# Patient Record
Sex: Male | Born: 1985 | Race: Black or African American | Hispanic: No | Marital: Single | Smoking: Never smoker
Health system: Southern US, Community
[De-identification: ages and names within clinical notes are randomized; demographics above are authoritative.]

---

## 2018-10-02 ENCOUNTER — Other Ambulatory Visit: Payer: Self-pay

## 2018-10-02 ENCOUNTER — Encounter (HOSPITAL_COMMUNITY): Payer: Self-pay

## 2018-10-02 ENCOUNTER — Emergency Department (HOSPITAL_COMMUNITY): Payer: PRIVATE HEALTH INSURANCE

## 2018-10-02 ENCOUNTER — Emergency Department (HOSPITAL_COMMUNITY)
Admission: EM | Admit: 2018-10-02 | Discharge: 2018-10-02 | Disposition: A | Discharge status: Home / Self Care | Payer: PRIVATE HEALTH INSURANCE | Attending: Emergency Medicine | Admitting: Emergency Medicine

## 2018-10-02 DIAGNOSIS — W208XXA Other cause of strike by thrown, projected or falling object, initial encounter: Secondary | ICD-10-CM | POA: Diagnosis not present

## 2018-10-02 DIAGNOSIS — S6991XA Unspecified injury of right wrist, hand and finger(s), initial encounter: Secondary | ICD-10-CM | POA: Diagnosis present

## 2018-10-02 DIAGNOSIS — S60221A Contusion of right hand, initial encounter: Secondary | ICD-10-CM | POA: Diagnosis not present

## 2018-10-02 DIAGNOSIS — Y999 Unspecified external cause status: Secondary | ICD-10-CM | POA: Insufficient documentation

## 2018-10-02 DIAGNOSIS — Z23 Encounter for immunization: Secondary | ICD-10-CM | POA: Diagnosis not present

## 2018-10-02 DIAGNOSIS — Y9389 Activity, other specified: Secondary | ICD-10-CM | POA: Diagnosis not present

## 2018-10-02 DIAGNOSIS — L03113 Cellulitis of right upper limb: Secondary | ICD-10-CM | POA: Insufficient documentation

## 2018-10-02 DIAGNOSIS — Y929 Unspecified place or not applicable: Secondary | ICD-10-CM | POA: Diagnosis not present

## 2018-10-02 MED ORDER — CLINDAMYCIN HCL 300 MG PO CAPS: 300.0000 mg | ORAL_CAPSULE | Freq: Four times a day (QID) | ORAL | 0 refills | Status: AC

## 2018-10-02 MED ORDER — CLINDAMYCIN HCL 150 MG PO CAPS
300.0000 mg | ORAL_CAPSULE | Freq: Once | ORAL | Status: AC
  Administered 2018-10-02: 300 mg via ORAL
  Filled 2018-10-02: qty 2

## 2018-10-02 MED ORDER — TETANUS-DIPHTH-ACELL PERTUSSIS 5-2.5-18.5 LF-MCG/0.5 IM SUSP
0.5000 mL | Freq: Once | INTRAMUSCULAR | Status: AC
  Administered 2018-10-02: 0.5 mL via INTRAMUSCULAR
  Filled 2018-10-02: qty 0.5

## 2018-10-02 NOTE — ED Notes (Signed)
ED Provider at bedside. 

## 2018-10-02 NOTE — ED Notes (Signed)
Pt. returned from XR. 

## 2018-10-02 NOTE — ED Triage Notes (Signed)
Pt reports was working on car Tuesday night, pt hit palm with a screwdriver. Pt reports he was wearing a glove and didn't think it pierced through. Since, pt has had redness spread.

## 2018-10-02 NOTE — ED Provider Notes (Signed)
[facility] EMERGENCY DEPARTMENT Provider Note   CSN: [id number] Arrival date & time: 10/02/18  1224    History   Chief Complaint Chief Complaint  Patient presents with  . Hand Injury    HPI Kevin Steele is a 33 y.o. male.     32yo male presents with right hand pain and swelling. Patient is right hand dominant, was wearing gloves 2 days ago while working on an old car- holding a screwdriver to try and remove a rusty bolt. Patient states his hand slipped on the screwdriver and it hit him on the palm. States the screwdriver did not puncture the glove (no hole in the glove) but he did have a small wound/blood on his hand. Patient reports developing pain and swelling with redness in his palm. Pain is worse with movement of his fingers but is able to fully flex and extend the fingers. Last td is unknown. No other injuries or concerns.      History reviewed. No pertinent past medical history.  There are no active problems to display for this patient.   History reviewed. No pertinent surgical history.      Home Medications    Prior to Admission medications   Medication Sig Start Date End Date Taking? Authorizing Provider  clindamycin (CLEOCIN) 300 MG capsule Take 1 capsule (300 mg total) by mouth every 6 (six) hours for 10 days. 10/02/18 10/12/18  Jeannie Fend, PA-C    Family History History reviewed. No pertinent family history.  Social History Social History   Tobacco Use  . Smoking status: Never Smoker  . Smokeless tobacco: Never Used  Substance Use Topics  . Alcohol use: Not on file  . Drug use: Not on file     Allergies   Patient has no known allergies.   Review of Systems Review of Systems  Constitutional: Negative for fever.  Musculoskeletal: Positive for myalgias. Negative for joint swelling.  Skin: Positive for color change and wound.  Allergic/Immunologic: Negative for immunocompromised state.  Neurological: Negative for  weakness and numbness.  Hematological: Does not bruise/bleed easily.     Physical Exam Updated Vital Signs BP 125/86   Pulse 93   Temp 98.5 F (36.9 C) (Oral)   Resp 16   Ht 5\' 11"  (1.803 m)   Wt 77.1 kg   SpO2 98%   BMI 23.71 kg/m   Physical Exam Vitals signs and nursing note reviewed.  Constitutional:      General: He is not in acute distress.    Appearance: He is well-developed. He is not diaphoretic.  HENT:     Head: Normocephalic and atraumatic.  Cardiovascular:     Pulses: Normal pulses.  Pulmonary:     Effort: Pulmonary effort is normal.  Musculoskeletal: Normal range of motion.        General: Swelling and tenderness present.       Hands:  Skin:    General: Skin is warm and dry.     Findings: Erythema present.  Neurological:     Mental Status: He is alert and oriented to person, place, and time.  Psychiatric:        Behavior: Behavior normal.    Media Information   Document Information   Photos    10/02/2018 13:33  Attached To:  Hospital Encounter on 10/02/18  Source Information   Alden Hipp  [facility]      ED Treatments / Results  Labs (all labs ordered are listed,  but only abnormal results are displayed) Labs Reviewed - No data to display  EKG None  Radiology Dg Hand Complete Right  Result Date: 10/02/2018 CLINICAL DATA:  pain and swelling, wound to palm with a screw driver 2 days ago EXAM: RIGHT HAND - COMPLETE 3+ VIEW COMPARISON:  None. FINDINGS: No fracture or bone lesion. No bone resorption to suggest osteomyelitis. The joints are normally spaced and aligned. No arthropathic changes. Soft tissues are unremarkable. No radiopaque foreign body or soft tissue air. IMPRESSION: Negative. Electronically Signed   By: Amie Portland M.D.   On: 10/02/2018 13:21    Procedures Procedures (including critical care time)  Medications Ordered in ED Medications  Tdap (BOOSTRIX) injection 0.5 mL (0.5 mLs Intramuscular Given  10/02/18 1323)  clindamycin (CLEOCIN) capsule 300 mg (300 mg Oral Given 10/02/18 1323)     Initial Impression / Assessment and Plan / ED Course  I have reviewed the triage vital signs and the nursing notes.  Pertinent labs & imaging results that were available during my care of the patient were reviewed by me and considered in my medical decision making (see chart for details).  Clinical Course as of Oct 01 1405  Thu Oct 02, 2018  1402 32yo male with concern for infection right palm after injury 2 days ago. Patient states the glove he was wearing was intact, reports superficial wound to the palm of his hand at the time of the injury, now with pain and swelling to his hand. On exam, no changes to dorsum of the hand. Does not appear to have tenosynovitis at this time but discussed importance of monitoring and return to ER if not improving, otherwise, follow up with ortho. Given rx for clindamycin. Xr negative for bony injury or FB. Case discussed with Dr. Madilyn Hook, ER attending, agrees with plan of care.    [LM]    Clinical Course User Index [LM] Jeannie Fend, PA-C   Final Clinical Impressions(s) / ED Diagnoses   Final diagnoses:  Cellulitis of right hand  Contusion of right hand, initial encounter    ED Discharge Orders         Ordered    clindamycin (CLEOCIN) 300 MG capsule  Every 6 hours     10/02/18 1351           Alden Hipp 10/02/18 1407    Tilden Fossa, MD 10/03/18 1044

## 2018-10-02 NOTE — Discharge Instructions (Signed)
Warm compresses for 20 minutes three times daily. Take Clindamycin as prescribed and complete the full course. Follow up with orthopedics, return to ER for any worsening or concerning symptoms as discussed.

## 2018-10-02 NOTE — ED Notes (Signed)
Declined W/C at D/C and was escorted to lobby by RN. 

## 2018-10-04 ENCOUNTER — Emergency Department (HOSPITAL_COMMUNITY): Payer: PRIVATE HEALTH INSURANCE

## 2018-10-04 ENCOUNTER — Emergency Department (HOSPITAL_COMMUNITY)
Admission: EM | Admit: 2018-10-04 | Discharge: 2018-10-04 | Disposition: A | Discharge status: Home / Self Care | Payer: PRIVATE HEALTH INSURANCE | Attending: Emergency Medicine | Admitting: Emergency Medicine

## 2018-10-04 ENCOUNTER — Other Ambulatory Visit: Payer: Self-pay

## 2018-10-04 ENCOUNTER — Encounter (HOSPITAL_COMMUNITY): Payer: Self-pay | Admitting: Emergency Medicine

## 2018-10-04 DIAGNOSIS — R0789 Other chest pain: Secondary | ICD-10-CM | POA: Diagnosis present

## 2018-10-04 DIAGNOSIS — R072 Precordial pain: Secondary | ICD-10-CM

## 2018-10-04 LAB — BASIC METABOLIC PANEL
ANION GAP: 13 (ref 5–15)
BUN: 15 mg/dL (ref 6–20)
CO2: 24 mmol/L (ref 22–32)
Calcium: 9.2 mg/dL (ref 8.9–10.3)
Chloride: 100 mmol/L (ref 98–111)
Creatinine, Ser: 1.13 mg/dL (ref 0.61–1.24)
GFR calc non Af Amer: >60 mL/min (ref >60)
Glucose, Bld: 145 mg/dL — ABNORMAL HIGH (ref 70–99)
Potassium: 3.7 mmol/L (ref 3.5–5.1)
Sodium: 137 mmol/L (ref 135–145)

## 2018-10-04 LAB — CBC
HCT: 46.1 % (ref 39.0–52.0)
Hemoglobin: 15 g/dL (ref 13.0–17.0)
MCH: 27.5 pg (ref 26.0–34.0)
MCHC: 32.5 g/dL (ref 30.0–36.0)
MCV: 84.4 fL (ref 80.0–100.0)
NRBC: 0 % (ref 0.0–0.2)
Platelets: 261 10*3/uL (ref 150–400)
RBC: 5.46 MIL/uL (ref 4.22–5.81)
RDW: 12.3 % (ref 11.5–15.5)
WBC: 10.2 10*3/uL (ref 4.0–10.5)

## 2018-10-04 LAB — I-STAT TROPONIN, ED: Troponin i, poc: 0.01 ng/mL (ref 0.00–0.08)

## 2018-10-04 MED ORDER — SODIUM CHLORIDE 0.9% FLUSH: 3.0000 mL | Freq: Once | INTRAVENOUS | Status: DC

## 2018-10-04 NOTE — ED Provider Notes (Signed)
Emergency Department Provider Note   I have reviewed the triage vital signs and the nursing notes.   HISTORY  Chief Complaint Chest Pain   HPI Kevin Steele is a 33 y.o. male presents to the emergency department for evaluation of chest discomfort starting at midnight.  Symptoms began shortly after taking his clindamycin which he has been prescribed for a wound to the right hand.  The patient states that several days ago he was attempting to remove a rusty bolt with a screwdriver and the screwdriver struck him in the hand.  He was wearing a glove but sustained a wound which subsequently became infected.  He was started on clindamycin.  He has been taking it on an empty stomach and last night developed chest pain shortly after taking this medication.  No vomiting.  No diarrhea.  No fevers or chills.  Patient feels like his hand is slightly improved.  No worsening pain in the hand, numbness, weakness.  Chest pain has resolved and patient is feeling better at the time of my evaluation.  History reviewed. No pertinent past medical history.  There are no active problems to display for this patient.   History reviewed. No pertinent surgical history.  Current Outpatient Rx  . Order #: [id number] Class: Print    Allergies Patient has no known allergies.  No family history on file.  Social History Social History   Tobacco Use  . Smoking status: Never Smoker  . Smokeless tobacco: Never Used  Substance Use Topics  . Alcohol use: Yes  . Drug use: Not Currently    Review of Systems  Constitutional: No fever/chills Eyes: No visual changes. ENT: No sore throat. Cardiovascular: Positive chest pain. Respiratory: Denies shortness of breath. Gastrointestinal: No abdominal pain.  No nausea, no vomiting.  No diarrhea.  No constipation. Genitourinary: Negative for dysuria. Musculoskeletal: Negative for back pain. Skin: Positive hand wound.  Neurological: Negative for headaches,  focal weakness or numbness.  10-point ROS otherwise negative.  ____________________________________________   PHYSICAL EXAM:  VITAL SIGNS: ED Triage Vitals [10/04/18 0420]  Enc Vitals Group     BP 132/82     Pulse Rate 82     Resp 18     Temp 98.6 F (37 C)     Temp Source Oral     SpO2 100 %     Pain Score 6   Constitutional: Alert and oriented. Well appearing and in no acute distress. Eyes: Conjunctivae are normal.  Head: Atraumatic. Nose: No congestion/rhinnorhea. Mouth/Throat: Mucous membranes are moist.  Neck: No stridor.   Cardiovascular: Normal rate, regular rhythm. Good peripheral circulation. Grossly normal heart sounds.   Respiratory: Normal respiratory effort.  No retractions. Lungs CTAB. Gastrointestinal: Soft and nontender. No distention.  Musculoskeletal: No lower extremity tenderness nor edema. No gross deformities of extremities. Neurologic:  Normal speech and language. No gross focal neurologic deficits are appreciated.  Skin:  Skin is warm, dry and intact. Puncture wound to the palm of the right hand. Mild surrounding erythema. No pus, fluctuance, or  Induration.    ____________________________________________   LABS (all labs ordered are listed, but only abnormal results are displayed)  Labs Reviewed  BASIC METABOLIC PANEL - Abnormal; Notable for the following components:      Result Value   Glucose, Bld 145 (*)    All other components within normal limits  CBC  I-STAT TROPONIN, ED   ____________________________________________  EKG   EKG Interpretation  Date/Time:  Saturday October 04 2018  04:14:36 EST Ventricular Rate:  89 PR Interval:  170 QRS Duration: 94 QT Interval:  368 QTC Calculation: 447 R Axis:   93 Text Interpretation:  Normal sinus rhythm Right atrial enlargement Rightward axis No STEMI.  Confirmed by Alona Bene [phone]) on 10/04/2018 7:06:40 AM       ____________________________________________  RADIOLOGY  Dg Chest 2  View  Result Date: 10/04/2018 CLINICAL DATA:  Chest pain EXAM: CHEST - 2 VIEW COMPARISON:  None. FINDINGS: The heart size and mediastinal contours are within normal limits. Both lungs are clear. The visualized skeletal structures are unremarkable. IMPRESSION: No active cardiopulmonary disease. Electronically Signed   By: Alcide Clever M.D.   On: 10/04/2018 05:33    ____________________________________________   PROCEDURES  Procedure(s) performed:   Procedures  None  ____________________________________________   INITIAL IMPRESSION / ASSESSMENT AND PLAN / ED COURSE  Pertinent labs & imaging results that were available during my care of the patient were reviewed by me and considered in my medical decision making (see chart for details).  Patient presents to the emergency department for evaluation of chest discomfort which began after taking his clindamycin for hand wound.  The hand wound is well-appearing and seems to be responding well to antibiotics.  Chest x-ray, labs, EKG reviewed.  Patient is low risk for ACS by HEART. PERC negative.  No findings on exam, lab work, history to suspect cardiovascular etiology.   At this time, I do not feel there is any life-threatening condition present. I have reviewed and discussed all results (EKG, imaging, lab, urine as appropriate), exam findings with patient. I have reviewed nursing notes and appropriate previous records.  I feel the patient is safe to be discharged home without further emergent workup. Discussed usual and customary return precautions. Patient and family (if present) verbalize understanding and are comfortable with this plan.  Patient will follow-up with their primary care provider. If they do not have a primary care provider, information for follow-up has been provided to them. All questions have been answered.  ____________________________________________  FINAL CLINICAL IMPRESSION(S) / ED DIAGNOSES  Final diagnoses:    Precordial chest pain    MEDICATIONS GIVEN DURING THIS VISIT:  Medications  sodium chloride flush (NS) 0.9 % injection 3 mL (has no administration in time range)   Note:  This document was prepared using Dragon voice recognition software and may include unintentional dictation errors.  Alona Bene, MD Emergency Medicine    Clay Solum, Arlyss Repress, MD 10/04/18 [phone]

## 2018-10-04 NOTE — ED Triage Notes (Signed)
Pt currently taking Clindamycin Rx for hand lac.  States he took medication tonight at midnight and 30 min later starting having pain to center of chest that is worse when lying down.  Denies SOB.  Vomited x 1 around 2:30am.

## 2018-10-04 NOTE — Discharge Instructions (Signed)
Take your antibiotic with a small amount of food and water. Return to the ED with any worsening chest pain, shortness of breathing, or other new/worsening symptoms.

## 2018-10-04 NOTE — ED Notes (Signed)
Patient verbalizes understanding of discharge instructions . Opportunity for questions and answers were provided . Armband removed by staff ,Pt discharged from ED. W/C  offered at D/C  and Declined W/C at D/C and was escorted to lobby by RN.  

## 2020-01-02 IMAGING — DX DG HAND COMPLETE 3+V*R*
3 series · 3 of 3 positions shown · non-contrast
Comparison: None.

CLINICAL DATA: pain and swelling, wound to palm with a screw driver
2 days ago

EXAM:
RIGHT HAND - COMPLETE 3+ VIEW

[x hand pa right]
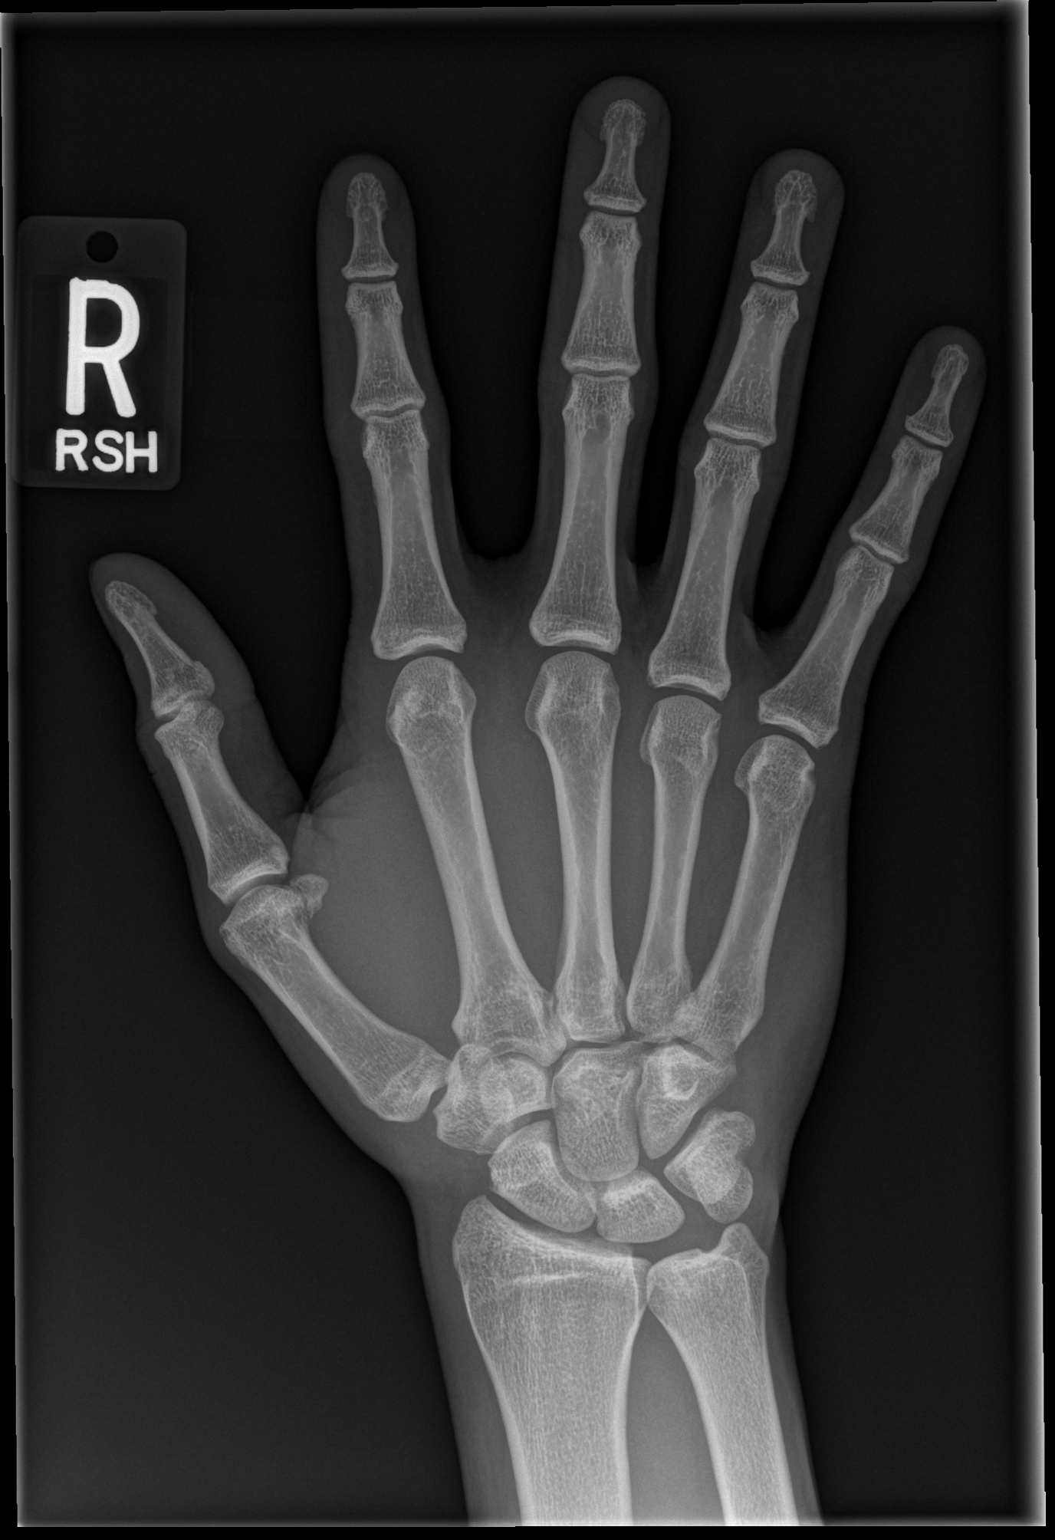

[x hand obl right]
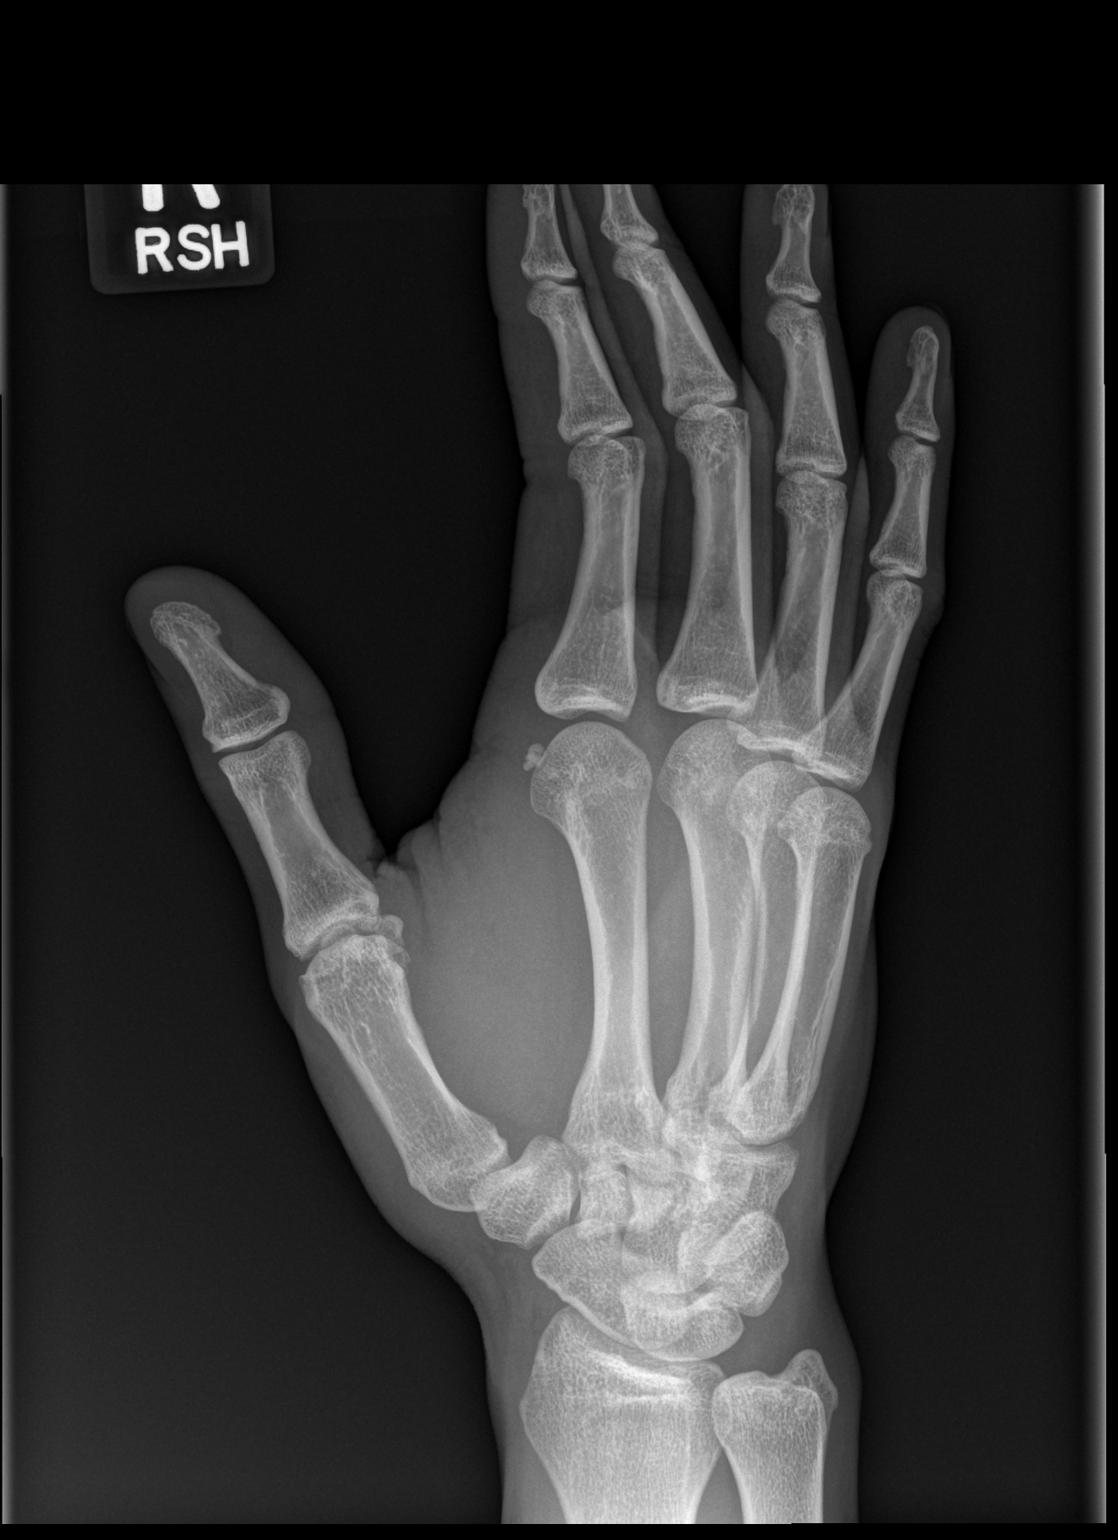

[x hand lat right]
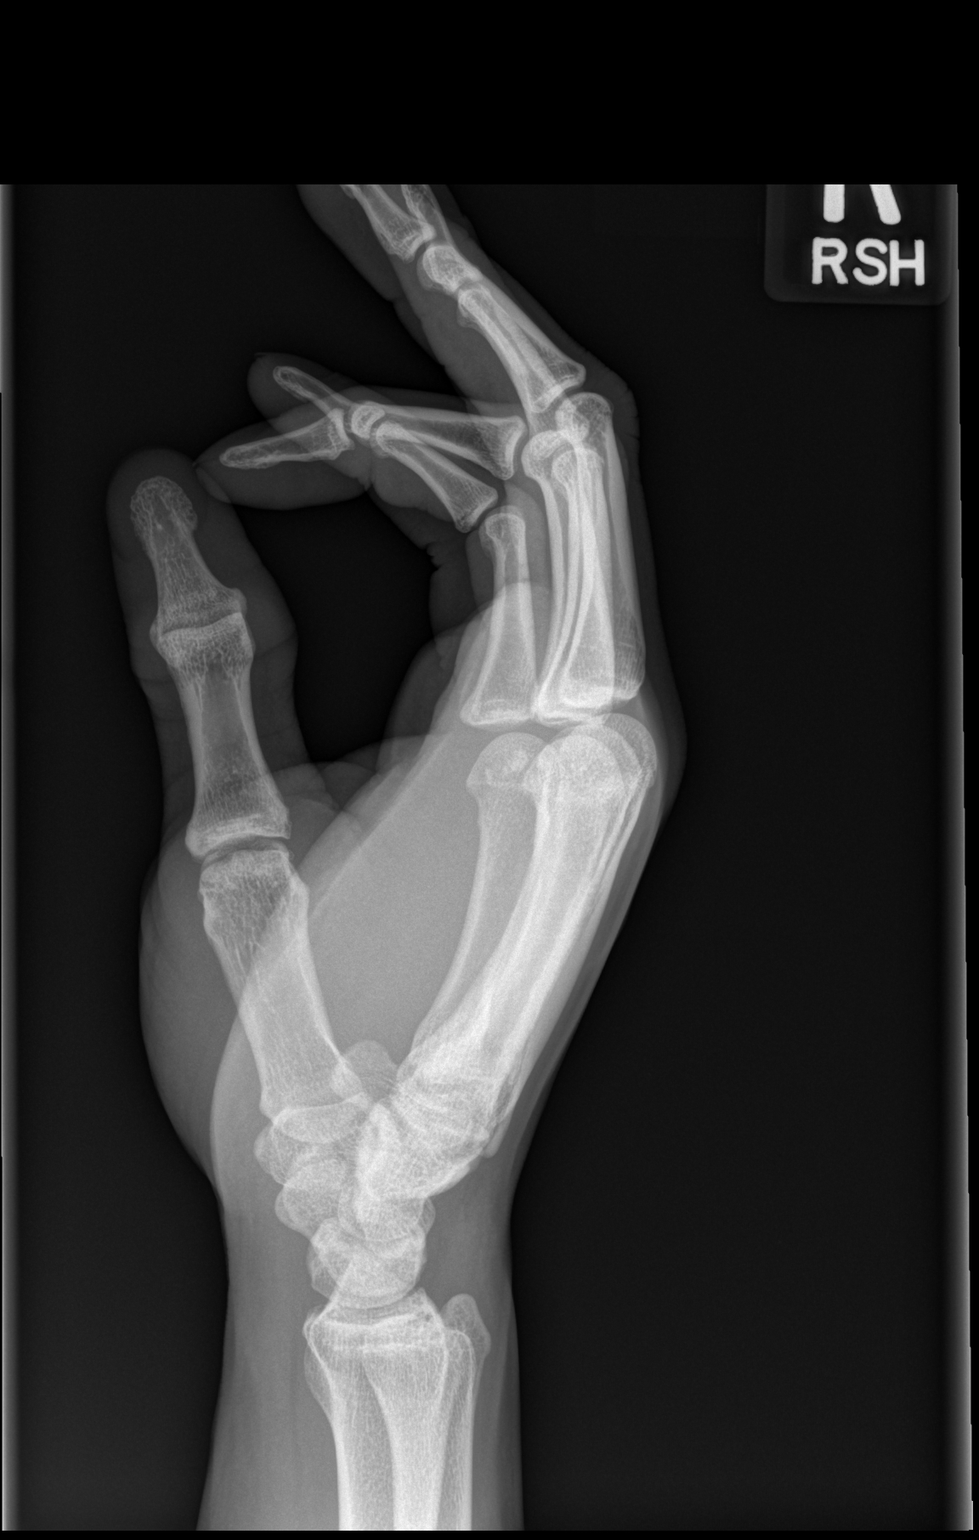

[3 of 3 positions shown; findings below may reference images not displayed]

FINDINGS: No fracture or bone lesion. No bone resorption to suggest
osteomyelitis.

The joints are normally spaced and aligned. No arthropathic changes.

Soft tissues are unremarkable. No radiopaque foreign body or soft
tissue air.
IMPRESSION: Negative.
